# Patient Record
Sex: Male | Born: 1990 | Race: Black or African American | Hispanic: No | Marital: Single | Smoking: Former smoker
Health system: Southern US, Community
[De-identification: ages and names within clinical notes are randomized; demographics above are authoritative.]

---
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
## 2012-12-10 ENCOUNTER — Encounter (HOSPITAL_COMMUNITY): Payer: Self-pay | Admitting: *Deleted

## 2012-12-10 ENCOUNTER — Emergency Department (HOSPITAL_COMMUNITY)
Admission: EM | Admit: 2012-12-10 | Discharge: 2012-12-11 | Disposition: A | Discharge status: Home / Self Care | Payer: Self-pay | Attending: Emergency Medicine | Admitting: Emergency Medicine

## 2012-12-10 DIAGNOSIS — K529 Noninfective gastroenteritis and colitis, unspecified: Secondary | ICD-10-CM

## 2012-12-10 DIAGNOSIS — K5289 Other specified noninfective gastroenteritis and colitis: Secondary | ICD-10-CM | POA: Insufficient documentation

## 2012-12-10 DIAGNOSIS — F17200 Nicotine dependence, unspecified, uncomplicated: Secondary | ICD-10-CM | POA: Insufficient documentation

## 2012-12-10 DIAGNOSIS — R1110 Vomiting, unspecified: Secondary | ICD-10-CM | POA: Insufficient documentation

## 2012-12-10 MED ORDER — ACETAMINOPHEN 325 MG PO TABS
650.0000 mg | ORAL_TABLET | Freq: Once | ORAL | Status: AC
  Administered 2012-12-10: 650 mg via ORAL
  Filled 2012-12-10: qty 2

## 2012-12-10 MED ORDER — ONDANSETRON 4 MG PO TBDP
4.0000 mg | ORAL_TABLET | Freq: Once | ORAL | Status: AC
Start: 2012-12-10 — End: 2012-12-10
  Administered 2012-12-10: 4 mg via ORAL
  Filled 2012-12-10: qty 1

## 2012-12-10 NOTE — ED Notes (Signed)
Pt c/o abdominal pain, n/v x 10 +. Pt presents w/ fever and chills and generalized body aches.

## 2012-12-10 NOTE — ED Provider Notes (Signed)
History   Scribed for Jeff Hutching, MD, the patient was seen in room WTR5/WTR5 . This chart was scribed by Lewanda Rife.   CSN: [id number]  Arrival date & time 12/10/12  1906   First MD Initiated Contact with Patient 12/10/12 2316      Chief Complaint  Patient presents with  . Fever  . Emesis    (Consider location/radiation/quality/duration/timing/severity/associated sxs/prior treatment) HPI Jeff Taylor is a 22 y.o. male who presents to the Emergency Department complaining of waxing and waning moderate left upper quadrant abdominal pain, fever, and emesis since this morning. Pt reports he was eating seafood out last night. Pt reports having 10 episodes of emesis with "beef stew appearance" since 6 am this morning, no blood, no bile. Pt states last episode of emesis was at 5 pm. Pt reports abdominal pain is 6/10 in severity. Pt reports having 4 episodes of diarrhea today. Pt denies melena, hematochezia, chest pain, and dysuria. Pt reports dizziness when standing up, chills, fever of 102.5, body aches, and nausea. Pt denies taking anything to treat symptoms prior to arrival. Pt denies any significant past medical hx.   History reviewed. No pertinent past medical history.  History reviewed. No pertinent past surgical history.  History reviewed. No pertinent family history.  History  Substance Use Topics  . Smoking status: Current Some Day Smoker  . Smokeless tobacco: Not on file  . Alcohol Use: No      Review of Systems  Constitutional: Positive for fever.  HENT: Negative.  Negative for neck pain and neck stiffness.   Respiratory: Negative.   Cardiovascular: Negative.   Gastrointestinal: Positive for nausea, vomiting, abdominal pain and diarrhea. Negative for constipation.  Genitourinary: Negative for dysuria, urgency, frequency and flank pain.  Musculoskeletal: Negative.  Negative for back pain.  Skin: Negative.   Neurological: Negative.   Hematological: Negative.     Psychiatric/Behavioral: Negative.   All other systems reviewed and are negative.    Allergies  Review of patient's allergies indicates no known allergies.  Home Medications  No current outpatient prescriptions on file.  BP 120/63  Pulse 99  Temp 102.5 F (39.2 C) (Oral)  Resp 16  Ht 6\' 2"  (1.88 m)  Wt 218 lb (98.884 kg)  BMI 27.99 kg/m2  SpO2 100%  Physical Exam  Nursing note and vitals reviewed. Constitutional: He is oriented to person, place, and time. He appears well-developed and well-nourished.  Non-toxic appearance.  HENT:  Head: Normocephalic and atraumatic.  Eyes: Conjunctivae normal and EOM are normal.  Neck: Normal range of motion. Neck supple.       No meningeal signs  Cardiovascular: Normal rate.   Pulmonary/Chest: Effort normal and breath sounds normal. He has no wheezes. He has no rales.  Abdominal: Soft. Bowel sounds are normal. He exhibits no distension and no mass. There is tenderness (mild tendernes left upper quadrant ). There is no rebound and no guarding.       No murphy, no rovsign, no pain at mcburney's point  Genitourinary:       No flank pain  Musculoskeletal: Normal range of motion. He exhibits no tenderness.       No flank pain  Neurological: He is alert and oriented to person, place, and time.  Skin: Skin is warm and dry.       Normal cap refill   Psychiatric: He has a normal mood and affect.    ED Course  Procedures (including critical care time)  Labs Reviewed -  No data to display No results found.    Gastroenteritis   MDM  Nausea and vomiting had subsided by the time patient was seen. Pt given tylenol for fever, zofran for nausea, and successfully completed fluid challenge. Pt states he had eaten shrimp at a restaurant the night before. No one else ate the shrimp and no other sick contacts. Clinical suspicion low for acute abdomen (SBO, appendicitis, gall bladder). Given sudden onset and subsiding of nausea and vomiting suspect  viral infection. Directed pt to return if symptoms persist or worsen.   Glade Nurse, PA-C 12/11/12 1321

## 2012-12-11 NOTE — ED Provider Notes (Signed)
Medical screening examination/treatment/procedure(s) were performed by non-physician practitioner and as supervising physician I was immediately available for consultation/collaboration.  Allycia Pitz, MD 12/11/12 1532 

## 2013-04-01 ENCOUNTER — Emergency Department (HOSPITAL_COMMUNITY)
Admission: EM | Admit: 2013-04-01 | Discharge: 2013-04-01 | Disposition: A | Discharge status: Home / Self Care | Payer: Self-pay | Attending: Emergency Medicine | Admitting: Emergency Medicine

## 2013-04-01 ENCOUNTER — Encounter (HOSPITAL_COMMUNITY): Payer: Self-pay | Admitting: *Deleted

## 2013-04-01 DIAGNOSIS — R45851 Suicidal ideations: Secondary | ICD-10-CM | POA: Insufficient documentation

## 2013-04-01 DIAGNOSIS — F17200 Nicotine dependence, unspecified, uncomplicated: Secondary | ICD-10-CM | POA: Insufficient documentation

## 2013-04-01 LAB — BASIC METABOLIC PANEL
BUN: 15 mg/dL (ref 6–23)
Chloride: 100 mEq/L (ref 96–112)
GFR calc Af Amer: >90 mL/min (ref >90)
GFR calc non Af Amer: >90 mL/min (ref >90)
Potassium: 3.8 mEq/L (ref 3.5–5.1)
Sodium: 138 mEq/L (ref 135–145)

## 2013-04-01 LAB — RAPID URINE DRUG SCREEN, HOSP PERFORMED
Barbiturates: NOT DETECTED
Benzodiazepines: NOT DETECTED
Opiates: NOT DETECTED

## 2013-04-01 LAB — CBC WITH DIFFERENTIAL/PLATELET
Basophils Absolute: 0 10*3/uL (ref 0.0–0.1)
Eosinophils Relative: 3 % (ref 0–5)
HCT: 43.8 % (ref 39.0–52.0)
Hemoglobin: 14.7 g/dL (ref 13.0–17.0)
Lymphs Abs: 2.1 10*3/uL (ref 0.7–4.0)
MCV: 84.4 fL (ref 78.0–100.0)
Platelets: 226 10*3/uL (ref 150–400)
WBC: 4.5 10*3/uL (ref 4.0–10.5)

## 2013-04-01 NOTE — ED Notes (Signed)
Mother called stating, "The officer told me that when he was asked if he does drugs, he said, 'no'. But he does. He smokes weed daily and I think that may be what is causing his depression, so if yall could help him with that too, please."

## 2013-04-01 NOTE — BH Assessment (Signed)
Assessment Note   Jeff Taylor is an 22 y.o. male. Pt reports for the past 2 years he's had multiple deaths in his family and 4 of them were close to him. His grandfather, Elisha Ponder, nephew and cousin.  He thinks about them a lot and today he was a lone and the thought of his grandfather not being here causes him to have suicidal thoughts which frighten him and he called 911. Pt  denies that he is suicidal and never thought of any plan nor had any intent.  Pt admits to ocassional use of marijuana.  He denies any other drug nor alcohol use. Pt contracts for safety and is referred to [facility]. Dr Deretha Emory agrees with disposition.  Axis I: Depressive Disorder NOS Axis II: Deferred Axis III: History reviewed. No pertinent past medical history. Axis IV: other psychosocial or environmental problems Axis V: 61-70 mild symptoms      Past Medical History: History reviewed. No pertinent past medical history.  History reviewed. No pertinent past surgical history.  Family History: History reviewed. No pertinent family history.  Social History:  reports that he has been smoking Cigarettes.  He has been smoking about 0.00 packs per day. He does not have any smokeless tobacco history on file. He reports that he uses illicit drugs (Marijuana). He reports that he does not drink alcohol.  Additional Social History:  Alcohol / Drug Use Pain Medications: na Prescriptions: na Over the Counter: na History of alcohol / drug use?: Yes  CIWA: CIWA-Ar BP: 140/77 mmHg Pulse Rate: 80 COWS:    Allergies: No Known Allergies  Home Medications:  (Not in a hospital admission)  OB/GYN Status:  No LMP for male patient.  General Assessment Data Location of Assessment: [facility] Assessment: Yes Living Arrangements: Parent Can pt return to current living arrangement?: Yes Admission Status: Voluntary Is patient capable of signing voluntary admission?: Yes Transfer from: Acute Hospital Referral Source:  MD     Risk to self Is patient at risk for suicide?: No Suicidal Plan?: No Access to Means: No What has been your use of drugs/alcohol within the last 12 months?: marujuana Previous Attempts/Gestures: No How many times?: 0 Other Self Harm Risks: na Triggers for Past Attempts: None known Intentional Self Injurious Behavior: None Family Suicide History: No Recent stressful life event(s):  (deaths in family over past 2 years) Persecutory voices/beliefs?: No Depression: Yes Depression Symptoms: Despondent;Insomnia Substance abuse history and/or treatment for substance abuse?: Yes Suicide prevention information given to non-admitted patients: Yes  Risk to Others Homicidal Ideation: No Thoughts of Harm to Others: No Current Homicidal Intent: No Current Homicidal Plan: No Access to Homicidal Means: No History of harm to others?: No Assessment of Violence: None Noted Violent Behavior Description: na Does patient have access to weapons?: No Criminal Charges Pending?: No Does patient have a court date: No  Psychosis Hallucinations: None noted Delusions: None noted  Mental Status Report Appear/Hygiene: Improved Eye Contact: Good Motor Activity: Freedom of movement Speech: Logical/coherent Level of Consciousness: Alert Mood: Depressed Affect: Sad;Appropriate to circumstance Anxiety Level: Minimal Thought Processes: Coherent;Relevant Judgement: Unimpaired Orientation: Person;Place;Time;Situation Obsessive Compulsive Thoughts/Behaviors: None  Cognitive Functioning Concentration: Normal Memory: Recent Intact;Remote Intact IQ: Average Insight: Good Impulse Control: Good Appetite: Good Sleep: Decreased Total Hours of Sleep: 4 Vegetative Symptoms: None  ADLScreening [facility] Assessment Services) Patient's cognitive ability adequate to safely complete daily activities?: Yes Patient able to express need for assistance with ADLs?: Yes Independently performs ADLs?: Yes  (appropriate for developmental  age)  Abuse/Neglect [facility]) Physical Abuse: Denies Verbal Abuse: Denies Sexual Abuse: Denies  Prior Inpatient Therapy Prior Inpatient Therapy: No Prior Therapy Dates: na Prior Therapy Facilty/Provider(s): na Reason for Treatment: na  Prior Outpatient Therapy Prior Outpatient Therapy: No Prior Therapy Dates: na Prior Therapy Facilty/Provider(s): na Reason for Treatment: na  ADL Screening (condition at time of admission) Patient's cognitive ability adequate to safely complete daily activities?: Yes Patient able to express need for assistance with ADLs?: Yes Independently performs ADLs?: Yes (appropriate for developmental age) Weakness of Legs: None Weakness of Arms/Hands: None  Home Assistive Devices/Equipment Home Assistive Devices/Equipment: None  Therapy Consults (therapy consults require a physician order) PT Evaluation Needed: No OT Evalulation Needed: No SLP Evaluation Needed: No Abuse/Neglect Assessment (Assessment to be complete while patient is alone) Physical Abuse: Denies Verbal Abuse: Denies Sexual Abuse: Denies Values / Beliefs Cultural Requests During Hospitalization: None Spiritual Requests During Hospitalization: None   Advance Directives (For Healthcare) Advance Directive: Patient does not have advance directive;Patient would like information Pre-existing out of facility DNR order (yellow form or pink MOST form): No    Additional Information 1:1 In Past 12 Months?: No CIRT Risk: No Elopement Risk: No Does patient have medical clearance?: Yes     Disposition: Pt contracted for safety and is referred to [facility] Disposition Initial Assessment Completed for this Encounter: Yes Disposition of Patient: Referred to Patient referred to:  ([facility])  On Site Evaluation by:   Reviewed with Physician:  Dr Marlou Sa, Samson Frederic Winford 04/01/2013 6:53 PM

## 2013-04-01 NOTE — ED Notes (Signed)
Pt's mother here to visit, was wanded per policy. Mother states son "smokes pot all day and this makes him depressed then he runs his mouth about everything". Mother visited briefly without confrontation. Act team member at bedside discussing plan of care and talking to pt.

## 2013-04-01 NOTE — ED Provider Notes (Addendum)
History    This chart was scribed for Jeff Jakes, MD by [name], ED Scribe. The patient was seen in room APA16A/APA16A. Patient's care was started at 1622.   CSN: [id number]  Arrival date & time 04/01/13  1622   First MD Initiated Contact with Patient 04/01/13 1642      No chief complaint on file.   The history is provided by the patient. No language interpreter was used.    HPI Comments:  Jeff Taylor is a 22 y.o. male with no significant medical hx, BIB Police to the Emergency Department for medical clearance of suicidal ideations. Pt called 911 today, reporting that he was suicidal and "upset with life in general." He denies having any plan of self injury or access to weapons at home, stating that he has "contemplated, but never attempted suicide." Pt denies SI now. He denies headache, diaphoresis, visual changes, fever, chills, nausea, vomiting, diarrhea, weakness, cough, SOB and any other pain. Pt is a current everyday smoker, admitting occassional marijuana and denying alcohol use.     History reviewed. No pertinent past medical history.  History reviewed. No pertinent past surgical history.  History reviewed. No pertinent family history.  History  Substance Use Topics  . Smoking status: Current Some Day Smoker    Types: Cigarettes  . Smokeless tobacco: Not on file  . Alcohol Use: No    Review of Systems  Constitutional: Negative for appetite change and fatigue.  HENT: Negative for congestion, sinus pressure and ear discharge.   Eyes: Negative for discharge.  Respiratory: Negative for cough and shortness of breath.   Cardiovascular: Negative for chest pain.  Gastrointestinal: Negative for abdominal pain and diarrhea.  Genitourinary: Negative for frequency and hematuria.  Musculoskeletal: Negative for back pain.  Skin: Negative for rash.  Neurological: Negative for headaches.  Hematological: Does not bruise/bleed easily.  Psychiatric/Behavioral:  Positive for suicidal ideas. Negative for confusion.    Allergies  Review of patient's allergies indicates no known allergies.  Home Medications  No current outpatient prescriptions on file.  BP 140/77  Pulse 80  Temp(Src) 97.6 F (36.4 C) (Oral)  Resp 20  Ht 6\' 2"  (1.88 m)  Wt 218 lb (98.884 kg)  BMI 27.98 kg/m2  SpO2 100%  Physical Exam  Constitutional: He is oriented to person, place, and time. He appears well-developed.  HENT:  Head: Normocephalic.  Eyes: Conjunctivae and EOM are normal. No scleral icterus.  Neck: Neck supple. No thyromegaly present.  Cardiovascular: Normal rate and regular rhythm.  Exam reveals no gallop and no friction rub.   No murmur heard. Pulmonary/Chest: Effort normal and breath sounds normal. No stridor. He has no wheezes. He has no rales. He exhibits no tenderness.  Abdominal: Soft. Bowel sounds are normal. He exhibits no distension. There is no tenderness. There is no rebound.  Musculoskeletal: Normal range of motion. He exhibits no edema.  Lymphadenopathy:    He has no cervical adenopathy.  Neurological: He is alert and oriented to person, place, and time. No cranial nerve deficit. He exhibits normal muscle tone. Coordination normal.  Skin: No rash noted. No erythema.  Psychiatric: He has a normal mood and affect. His behavior is normal.    ED Course  Procedures DIAGNOSTIC STUDIES: Oxygen Saturation is 100% on room air, normal by my interpretation.    COORDINATION OF CARE: 17:23- Evaluated Pt. Pt is awake, alert, and without distress. 17:27- Patient understands and agrees with initial ED impression and plan with expectations  set for ED visit.     Labs Reviewed  CBC WITH DIFFERENTIAL  BASIC METABOLIC PANEL  URINE RAPID DRUG SCREEN (HOSP PERFORMED)   No results found. Results for orders placed during the hospital encounter of 04/01/13  CBC WITH DIFFERENTIAL      Result Value Range   WBC 4.5  4.0 - 10.5 K/uL   RBC 5.19  4.22 -  5.81 MIL/uL   Hemoglobin 14.7  13.0 - 17.0 g/dL   HCT 16.1  09.6 - 04.5 %   MCV 84.4  78.0 - 100.0 fL   MCH 28.3  26.0 - 34.0 pg   MCHC 33.6  30.0 - 36.0 g/dL   RDW 40.9  81.1 - 91.4 %   Platelets 226  150 - 400 K/uL   Neutrophils Relative 43  43 - 77 %   Neutro Abs 1.9  1.7 - 7.7 K/uL   Lymphocytes Relative 46  12 - 46 %   Lymphs Abs 2.1  0.7 - 4.0 K/uL   Monocytes Relative 7  3 - 12 %   Monocytes Absolute 0.3  0.1 - 1.0 K/uL   Eosinophils Relative 3  0 - 5 %   Eosinophils Absolute 0.2  0.0 - 0.7 K/uL   Basophils Relative 0  0 - 1 %   Basophils Absolute 0.0  0.0 - 0.1 K/uL  BASIC METABOLIC PANEL      Result Value Range   Sodium 138  135 - 145 mEq/L   Potassium 3.8  3.5 - 5.1 mEq/L   Chloride 100  96 - 112 mEq/L   CO2 28  19 - 32 mEq/L   Glucose, Bld 87  70 - 99 mg/dL   BUN 15  6 - 23 mg/dL   Creatinine, Ser 7.82  0.50 - 1.35 mg/dL   Calcium 9.7  8.4 - 95.6 mg/dL   GFR calc non Af Amer >90  >90 mL/min   GFR calc Af Amer >90  >90 mL/min     1. Suicidal ideation       MDM  Patient seen by [facility] behavioral health team member. Patient has signed a contract for safety is no longer suicidal and can be discharged home. Will be given behavioral health referral information. Patient to return for any new or worse symptoms or any recurrent feelings of suicide. The patient's urine drug screen is not back yet but would be positive for marijuana as per behavioral health t patient is nontoxic no acute distress.eam patient admits to marijuana use.      I personally performed the services described in this documentation, which was scribed in my presence. The recorded information has been reviewed and is accurate.      Jeff Jakes, MD 04/01/13 1850  Jeff Jakes, MD 04/01/13 [phone]

## 2013-04-01 NOTE — ED Notes (Addendum)
Pt brought in by police, tearful, Asking to call his mother.Police officer says pt called and threatened to hurt himself.  Was on phone with girl friend when police arrived.

## 2019-09-13 ENCOUNTER — Other Ambulatory Visit: Payer: Self-pay | Admitting: *Deleted

## 2019-09-13 DIAGNOSIS — Z20822 Contact with and (suspected) exposure to covid-19: Secondary | ICD-10-CM

## 2019-09-15 LAB — NOVEL CORONAVIRUS, NAA: SARS-CoV-2, NAA: NOT DETECTED

## 2020-06-06 ENCOUNTER — Ambulatory Visit (INDEPENDENT_AMBULATORY_CARE_PROVIDER_SITE_OTHER): Payer: Self-pay | Admitting: Orthopaedic Surgery

## 2020-06-06 ENCOUNTER — Ambulatory Visit (INDEPENDENT_AMBULATORY_CARE_PROVIDER_SITE_OTHER): Payer: Self-pay

## 2020-06-06 ENCOUNTER — Encounter: Payer: Self-pay | Admitting: Orthopaedic Surgery

## 2020-06-06 VITALS — Ht 73.5 in | Wt 243.2 lb

## 2020-06-06 DIAGNOSIS — M79652 Pain in left thigh: Secondary | ICD-10-CM

## 2020-06-06 MED ORDER — IBUPROFEN 800 MG PO TABS: 800.0000 mg | ORAL_TABLET | Freq: Three times a day (TID) | ORAL | 2 refills | Status: DC | PRN

## 2020-06-06 NOTE — Progress Notes (Signed)
Office Visit Note   Patient: Jeff Taylor           Date of Birth: 1991/04/19           MRN: [id number] Visit Date: 06/06/2020              Requested by: No referring provider defined for this encounter. PCP: Patient, No Pcp Per   Assessment & Plan: Visit Diagnoses:  1. Left thigh pain     Plan: Impression is a mild quadriceps strain versus partial tear.  I have recommended outpatient physical therapy for the next few weeks.  He needs to be out of sports until then.  Recheck in 3 weeks.  Ibuprofen was prescribed today.  Follow-Up Instructions: Return in about 3 weeks (around 06/27/2020).   Orders:  Orders Placed This Encounter  Procedures   XR FEMUR MIN 2 VIEWS LEFT   Ambulatory referral to Physical Therapy   Meds ordered this encounter  Medications   ibuprofen (ADVIL) 800 MG tablet    Sig: Take 1 tablet (800 mg total) by mouth every 8 (eight) hours as needed.    Dispense:  30 tablet    Refill:  2      Procedures: No procedures performed   Clinical Data: No additional findings.   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  This started acutely when he was at a trampoline park and jumped off of the jousting beam and landed on the floor on his feet and felt acute pain in his thigh.  He was unable to run during practice due to the pain.  He is able to squat with his body weight but he definitely feels discomfort in the thigh.  Denies any groin pain or knee pain.  Denies any numbness and tingling.   Review of Systems  Constitutional: Negative.   All other systems reviewed and are negative.    Objective: Vital Signs: Ht 6' 1.5" (1.867 m)    Wt 243 lb 3.2 oz (110.3 kg)    BMI 31.65 kg/m   Physical Exam Vitals and nursing note reviewed.   Constitutional:      Appearance: He is well-developed.  HENT:     Head: Normocephalic and atraumatic.  Eyes:     Pupils: Pupils are equal, round, and reactive to light.  Pulmonary:     Effort: Pulmonary effort is normal.  Abdominal:     Palpations: Abdomen is soft.  Musculoskeletal:        General: Normal range of motion.     Cervical back: Neck supple.  Skin:    General: Skin is warm.  Neurological:     Mental Status: He is alert and oriented to person, place, and time.  Psychiatric:        Behavior: Behavior normal.        Thought Content: Thought content normal.        Judgment: Judgment normal.     Ortho Exam Left hip and knee exams are unremarkable. He has slight tenderness in the proximal portion of the quadriceps.  He has minimal weakness to resisted knee extension.  He does have reproducible discomfort with resistance to quad activation. Specialty Comments:  No specialty comments available.  Imaging: XR FEMUR MIN 2 VIEWS  LEFT  Result Date: 06/06/2020 No acute or structural abnormalities    PMFS History: There are no problems to display for this patient.  History reviewed. No pertinent past medical history.  History reviewed. No pertinent family history.  History reviewed. No pertinent surgical history. Social History   Occupational History   Not on file  Tobacco Use   Smoking status: Former Smoker    Types: Cigarettes    Quit date: 04/06/2020    Years since quitting: 0.1   Smokeless tobacco: Never Used  Substance and Sexual Activity   Alcohol use: No   Drug use: Yes    Types: Marijuana    Comment: occasionally   Sexual activity: Not on file

## 2020-06-07 ENCOUNTER — Emergency Department (HOSPITAL_COMMUNITY): Payer: Managed Care, Other (non HMO)

## 2020-06-07 ENCOUNTER — Encounter (HOSPITAL_COMMUNITY): Payer: Self-pay

## 2020-06-07 ENCOUNTER — Other Ambulatory Visit: Payer: Self-pay

## 2020-06-07 ENCOUNTER — Emergency Department (HOSPITAL_COMMUNITY)
Admission: EM | Admit: 2020-06-07 | Discharge: 2020-06-07 | Disposition: A | Discharge status: Home / Self Care | Payer: Managed Care, Other (non HMO) | Attending: Emergency Medicine | Admitting: Emergency Medicine

## 2020-06-07 DIAGNOSIS — X58XXXA Exposure to other specified factors, initial encounter: Secondary | ICD-10-CM | POA: Insufficient documentation

## 2020-06-07 DIAGNOSIS — T189XXA Foreign body of alimentary tract, part unspecified, initial encounter: Secondary | ICD-10-CM | POA: Insufficient documentation

## 2020-06-07 DIAGNOSIS — Z87891 Personal history of nicotine dependence: Secondary | ICD-10-CM | POA: Insufficient documentation

## 2020-06-07 DIAGNOSIS — Y929 Unspecified place or not applicable: Secondary | ICD-10-CM | POA: Insufficient documentation

## 2020-06-07 DIAGNOSIS — Y939 Activity, unspecified: Secondary | ICD-10-CM | POA: Insufficient documentation

## 2020-06-07 DIAGNOSIS — Z87821 Personal history of retained foreign body fully removed: Secondary | ICD-10-CM | POA: Insufficient documentation

## 2020-06-07 DIAGNOSIS — Y999 Unspecified external cause status: Secondary | ICD-10-CM | POA: Insufficient documentation

## 2020-06-07 NOTE — ED Triage Notes (Signed)
Pt went to eat at [employer] and when he bit into the hamburger he noticed little pieces of glass in the hamburger. Pt had swallowed some hamburger prior to noticing the glass.

## 2020-06-07 NOTE — Discharge Instructions (Addendum)
At this time there does not appear to be the presence of an emergent medical condition, however there is always the potential for conditions to change. Please read and follow the below instructions.  Please return to the Emergency Department immediately for any new or worsening symptoms. Please be sure to follow up with your Primary Care Provider within one week regarding your visit today; please call their office to schedule an appointment even if you are feeling better for a follow-up visit.  Call  community health and wellness to establish a primary care provider if you do not already have one. You may call the gastroenterologist Dr. Kendell Bane on your discharge paperwork to schedule follow-up appointment.  Get help right away if: You have a fever. You have pain in your chest or your belly. You cough up blood. You have blood in your poop. You have blood in your vomit after treatment. You have any new/concerning or worsening of symptoms  Please read the additional information packets attached to your discharge summary.  Do not take your medicine if  develop an itchy rash, swelling in your mouth or lips, or difficulty breathing; call 911 and seek immediate emergency medical attention if this occurs.  You may review your lab tests and imaging results in their entirety on your MyChart account.  Please discuss all results of fully with your primary care provider and other specialist at your follow-up visit.  Note: Portions of this text may have been transcribed using voice recognition software. Every effort was made to ensure accuracy; however, inadvertent computerized transcription errors may still be present.

## 2020-06-07 NOTE — ED Provider Notes (Signed)
[facility] EMERGENCY DEPARTMENT Provider Note   CSN: [id number] Arrival date & time: 06/07/20  1518     History Chief Complaint  Patient presents with  . Swallowed Foreign Body    Jeff Taylor is a 29 y.o. male otherwise healthy no daily medication use presents today for concern of swallowing small shards of glass.  He reports that at lunchtime today he was eating a [employer] when he bit into a hamburger and noticed a few very small pieces of glass.  He had already eaten some the hamburger by this time.  He denies any fever/chills, sore throat, cough, pain, nausea/vomiting, diarrhea, hematochezia/melena or any additional concerns.  HPI     History reviewed. No pertinent past medical history.  There are no problems to display for this patient.   History reviewed. No pertinent surgical history.     No family history on file.  Social History   Tobacco Use  . Smoking status: Former Smoker    Types: Cigarettes    Quit date: 04/06/2020    Years since quitting: 0.1  . Smokeless tobacco: Never Used  Substance Use Topics  . Alcohol use: No  . Drug use: Not Currently    Types: Marijuana    Comment: occasionally    Home Medications Prior to Admission medications   Medication Sig Start Date End Date Taking? Authorizing Provider  ibuprofen (ADVIL) 800 MG tablet Take 1 tablet (800 mg total) by mouth every 8 (eight) hours as needed. Patient taking differently: Take 800 mg by mouth every 8 (eight) hours as needed for mild pain or moderate pain (for leg pain).  06/06/20  Yes Tarry Kos, MD    Allergies    Patient has no known allergies.  Review of Systems   Review of Systems Ten systems are reviewed and are negative for acute change except as noted in the HPI  Physical Exam Updated Vital Signs BP (!) 120/92 (BP Location: Left Arm)   Pulse 70   Temp 98.3 F (36.8 C) (Oral)   Resp 17   Ht 6\' 2"  (1.88 m)   Wt 106.6 kg   SpO2 99%   BMI 30.17 kg/m    Physical Exam Constitutional:      General: He is not in acute distress.    Appearance: Normal appearance. He is well-developed. He is not ill-appearing or diaphoretic.  HENT:     Head: Normocephalic and atraumatic.     Jaw: There is normal jaw occlusion.     Mouth/Throat:     Mouth: Mucous membranes are moist.     Pharynx: Oropharynx is clear.     Comments: No bleeding, lacerations or visible injury. Eyes:     General: Vision grossly intact. Gaze aligned appropriately.     Pupils: Pupils are equal, round, and reactive to light.  Neck:     Trachea: Trachea and phonation normal.  Pulmonary:     Effort: Pulmonary effort is normal. No respiratory distress.  Abdominal:     General: There is no distension.     Palpations: Abdomen is soft.     Tenderness: There is no abdominal tenderness. There is no guarding or rebound.  Musculoskeletal:        General: Normal range of motion.     Cervical back: Normal range of motion.  Skin:    General: Skin is warm and dry.  Neurological:     Mental Status: He is alert.     GCS: GCS eye subscore  is 4. GCS verbal subscore is 5. GCS motor subscore is 6.     Comments: Speech is clear and goal oriented, follows commands Major Cranial nerves without deficit, no facial droop Moves extremities without ataxia, coordination intact  Psychiatric:        Behavior: Behavior normal.     ED Results / Procedures / Treatments   Labs (all labs ordered are listed, but only abnormal results are displayed) Labs Reviewed - No data to display  EKG None  Radiology DG Abdomen Acute W/Chest  Result Date: 06/07/2020 CLINICAL DATA:  Question swallowed glass. Found broken glass in food well eating. Abdominal discomfort. EXAM: DG ABDOMEN ACUTE W/ 1V CHEST COMPARISON:  None. FINDINGS: The cardiomediastinal contours are normal. The lungs are clear. No evidence of pneumomediastinum. No pleural fluid or pneumothorax. No radiopaque foreign bodies. There is no free  intra-abdominal air. No dilated bowel loops to suggest obstruction. Small volume of stool throughout the colon. No radiopaque calculi. No acute osseous abnormalities are seen. IMPRESSION: 1. No radiopaque foreign body or evidence of swallowed glass. No obstruction or free air. 2. Clear lungs.  No acute pulmonary process. Electronically Signed   By: Narda Rutherford M.D.   On: 06/07/2020 17:26   XR FEMUR MIN 2 VIEWS LEFT  Result Date: 06/06/2020 No acute or structural abnormalities   Procedures Procedures (including critical care time)  Medications Ordered in ED Medications - No data to display  ED Course  I have reviewed the triage vital signs and the nursing notes.  Pertinent labs & imaging results that were available during my care of the patient were reviewed by me and considered in my medical decision making (see chart for details).  Clinical Course as of Jun 07 1805  Thu Jun 07, 2020  1752 Dr. Jena Gauss   [BM]    Clinical Course User Index [BM] Elizabeth Palau   MDM Rules/Calculators/A&P                          DG Abdomen/Chest:  IMPRESSION:  1. No radiopaque foreign body or evidence of swallowed glass. No  obstruction or free air.  2. Clear lungs. No acute pulmonary process.  - Patient reassessed she is resting comfortably no acute distress.  No intraoral injury, no neck pain, no chest pain or cough, no abdominal pain.  He is overall well-appearing and in no acute distress requesting discharge.  I discussed the case with gastroenterologist Dr. Jena Gauss advised that such a small foreign body would likely passed through without adverse event but can give patient his contact information to schedule follow-up appointment as needed.  Patient is agreeable to plan and discharge, contact information for Dr. Luvenia Starch office given.  At this time there does not appear to be any evidence of an acute emergency medical condition and the patient appears stable for discharge with  appropriate outpatient follow up. Diagnosis was discussed with patient who verbalizes understanding of care plan and is agreeable to discharge. I have discussed return precautions with patient who verbalizes understanding. Patient encouraged to follow-up with their PCP and GI. All questions answered.  Patient's case discussed with Dr. Deretha Emory who agrees with plan to discharge with follow-up.   Note: Portions of this report may have been transcribed using voice recognition software. Every effort was made to ensure accuracy; however, inadvertent computerized transcription errors may still be present. Final Clinical Impression(s) / ED Diagnoses Final diagnoses:  History of swallowed foreign body  Rx / DC Orders ED Discharge Orders    None       Elizabeth Palau 06/07/20 1806    Vanetta Mulders, MD 06/08/20 [phone]

## 2020-06-21 ENCOUNTER — Ambulatory Visit: Payer: Managed Care, Other (non HMO) | Attending: Orthopaedic Surgery | Admitting: Physical Therapy

## 2020-06-27 ENCOUNTER — Ambulatory Visit: Payer: Managed Care, Other (non HMO) | Admitting: Orthopaedic Surgery

## 2021-09-24 ENCOUNTER — Ambulatory Visit
Admission: EM | Admit: 2021-09-24 | Discharge: 2021-09-24 | Disposition: A | Discharge status: Home / Self Care | Payer: Managed Care, Other (non HMO) | Attending: Family Medicine | Admitting: Family Medicine

## 2021-09-24 ENCOUNTER — Encounter: Payer: Self-pay | Admitting: Emergency Medicine

## 2021-09-24 DIAGNOSIS — J069 Acute upper respiratory infection, unspecified: Secondary | ICD-10-CM | POA: Diagnosis not present

## 2021-09-24 LAB — POCT INFLUENZA A/B
Influenza A, POC: NEGATIVE
Influenza B, POC: NEGATIVE

## 2021-09-24 MED ORDER — OSELTAMIVIR PHOSPHATE 75 MG PO CAPS: 75.0000 mg | ORAL_CAPSULE | Freq: Two times a day (BID) | ORAL | 0 refills | Status: AC

## 2021-09-24 MED ORDER — IBUPROFEN 800 MG PO TABS: 800.0000 mg | ORAL_TABLET | Freq: Three times a day (TID) | ORAL | 0 refills | Status: AC

## 2021-09-24 NOTE — ED Triage Notes (Signed)
Pt presents with chills, body aches, cough, and nose bleeds that started this am.   Pt denies tylenol, states will take when he gets home.

## 2021-09-24 NOTE — Discharge Instructions (Signed)

## 2021-09-25 NOTE — ED Provider Notes (Signed)
  [facility]   [id number] 09/24/21 Arrival Time: 1419  ASSESSMENT & PLAN:  1. Viral URI with cough    Discussed typical duration of viral illnesses. Rapid flu negative. OTC symptom care as needed.  Meds ordered this encounter  Medications   oseltamivir (TAMIFLU) 75 MG capsule    Sig: Take 1 capsule (75 mg total) by mouth 2 (two) times daily for 5 days.    Dispense:  10 capsule    Refill:  0   ibuprofen (ADVIL) 800 MG tablet    Sig: Take 1 tablet (800 mg total) by mouth 3 (three) times daily with meals.    Dispense:  21 tablet    Refill:  0     Follow-up Information     [facility].   Specialty: Urgent Care Why: If worsening or failing to improve as anticipated. Contact information: [address] [place] [address] [phone]                Reviewed expectations re: course of current medical issues. Questions answered. Outlined signs and symptoms indicating need for more acute intervention. Understanding verbalized. After Visit Summary given.   SUBJECTIVE: History from: patient. Jeff Taylor is a 30 y.o. male who reports: chills, body aches, cough; one nose bleed; onset this am. Denies: fever. Normal PO intake without n/v/d.   OBJECTIVE:  Vitals:   09/24/21 1617  BP: (!) 142/84  Pulse: (!) 105  Resp: 17  Temp: (!) 102.2 F (39 C)  TempSrc: Oral  SpO2: 97%    Mild tachycardia and temp noted.  General appearance: alert; no distress Eyes: PERRLA; EOMI; conjunctiva normal HENT: [place]; AT; with nasal congestion Neck: supple  Lungs: speaks full sentences without difficulty; unlabored; clear Extremities: no edema Skin: warm and dry Neurologic: normal gait Psychological: alert and cooperative; normal mood and affect  Labs: Results for orders placed or performed during the hospital encounter of 09/24/21  POCT Influenza A/B  Result Value Ref Range   Influenza A, POC Negative  Negative   Influenza B, POC Negative Negative   Labs Reviewed  POCT INFLUENZA A/B    No Known Allergies  History reviewed. No pertinent past medical history. Social History   Socioeconomic History   Marital status: Single    Spouse name: Not on file   Number of children: Not on file   Years of education: Not on file   Highest education level: Not on file  Occupational History   Not on file  Tobacco Use   Smoking status: Former    Types: Cigarettes    Quit date: 04/06/2020    Years since quitting: 1.4   Smokeless tobacco: Never  Substance and Sexual Activity   Alcohol use: No   Drug use: Not Currently    Types: Marijuana    Comment: occasionally   Sexual activity: Not on file  Other Topics Concern   Not on file  Social History Narrative   Not on file   Social Determinants of Health   Financial Resource Strain: Not on file  Food Insecurity: Not on file  Transportation Needs: Not on file  Physical Activity: Not on file  Stress: Not on file  Social Connections: Not on file  Intimate Partner Violence: Not on file   History reviewed. No pertinent family history. History reviewed. No pertinent surgical history.   Mardella Layman, MD 09/25/21 [phone]

## 2022-01-28 IMAGING — DX DG ABDOMEN ACUTE W/ 1V CHEST
4 series · 4 of 4 positions shown · non-contrast
Comparison: None.

CLINICAL DATA: Question swallowed glass. Found broken glass in food
well eating. Abdominal discomfort.

EXAM:
DG ABDOMEN ACUTE W/ 1V CHEST

[chest pa]
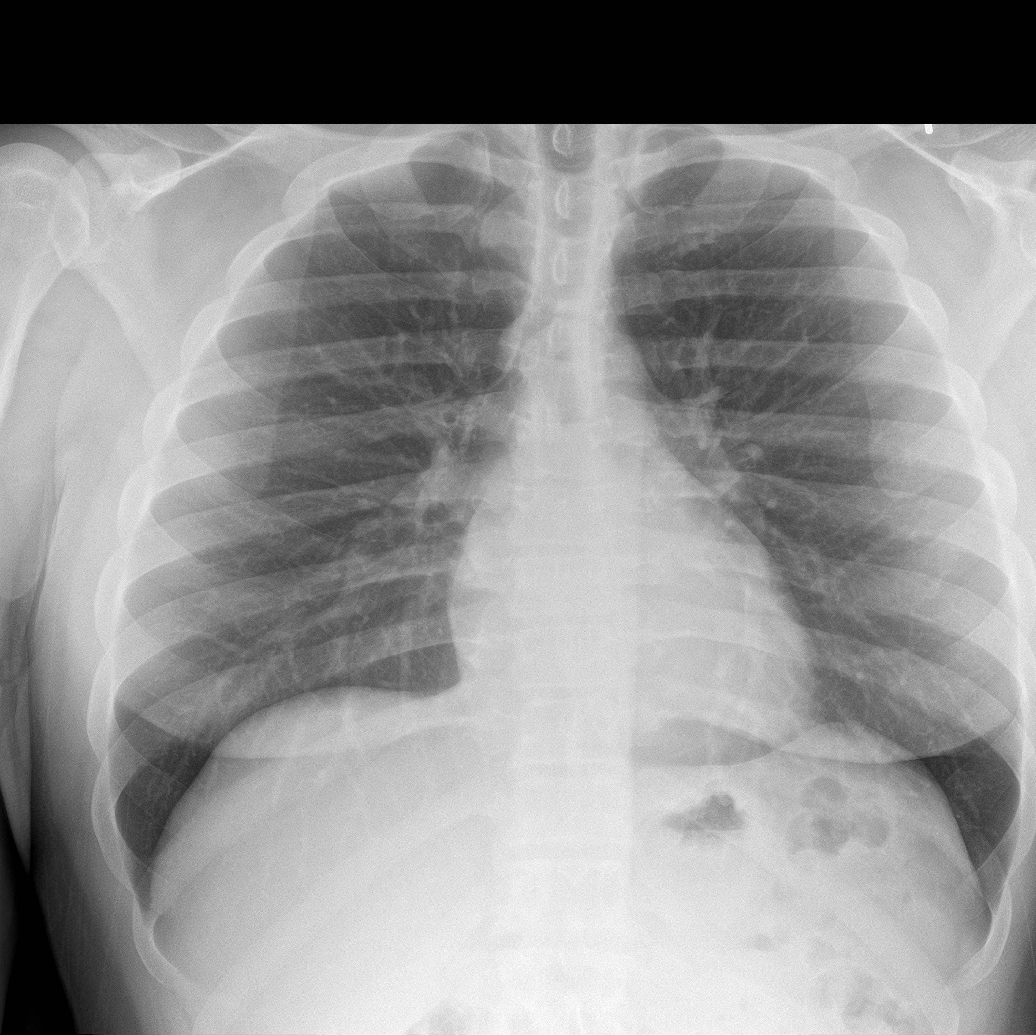

[abdomen erect]
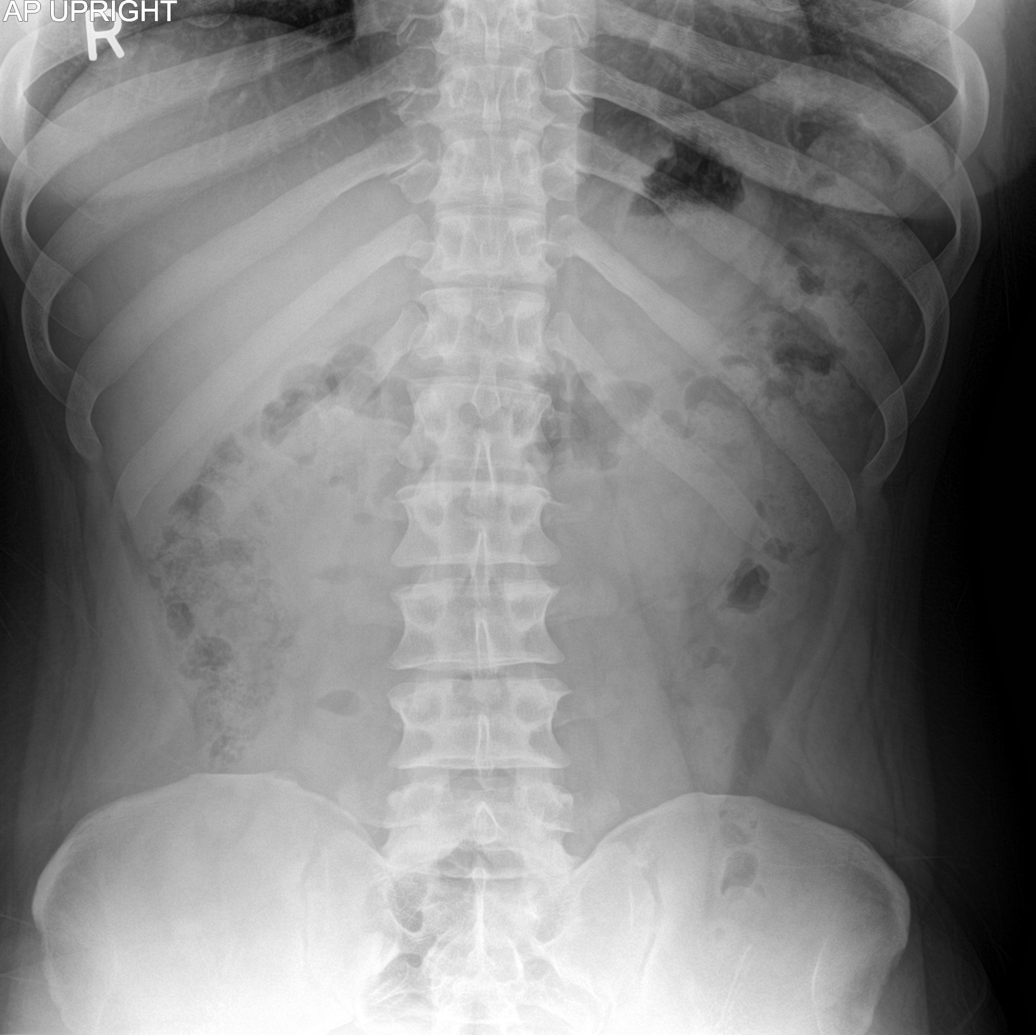

[abdomen supine]
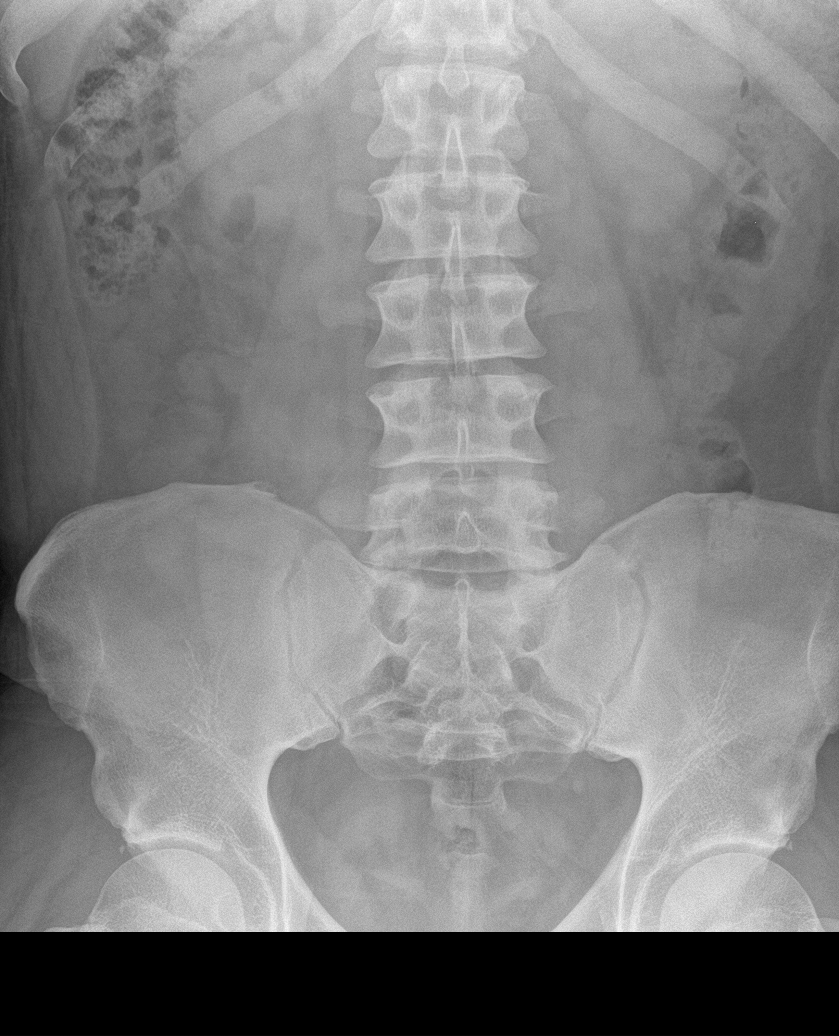

[abdomen kub]
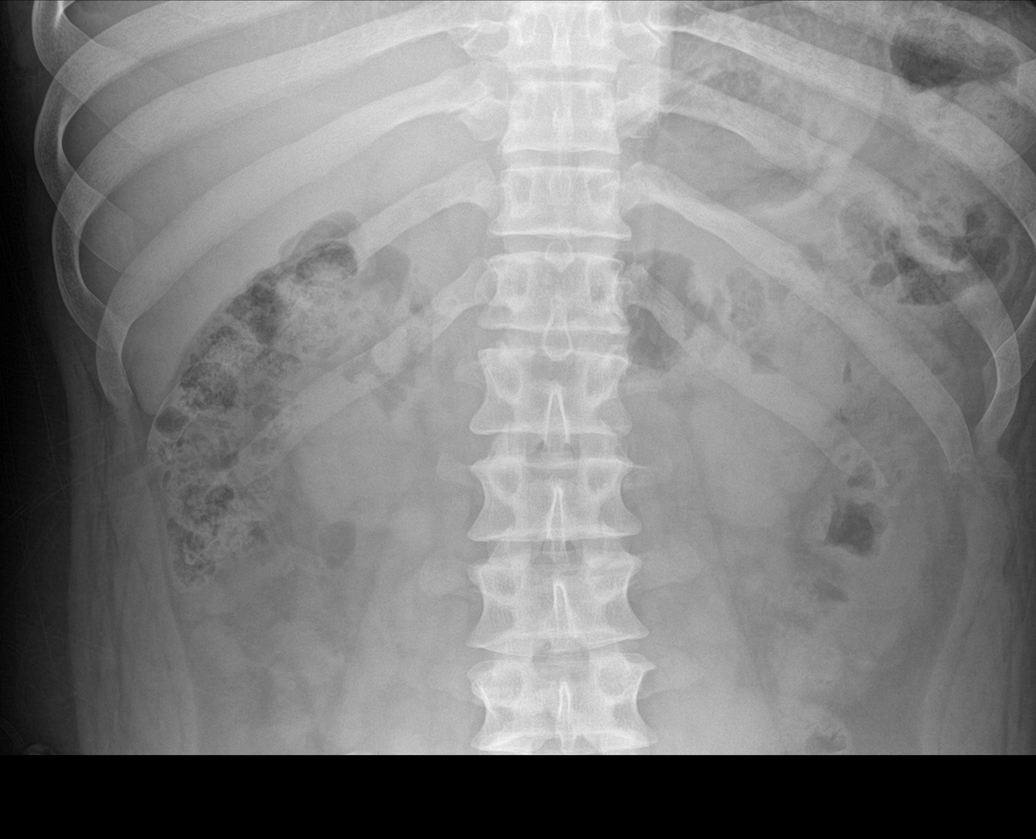

[4 of 4 positions shown; findings below may reference images not displayed]

FINDINGS: The cardiomediastinal contours are normal. The lungs are clear. No
evidence of pneumomediastinum. No pleural fluid or pneumothorax.

No radiopaque foreign bodies. There is no free intra-abdominal air.
No dilated bowel loops to suggest obstruction. Small volume of stool
throughout the colon. No radiopaque calculi. No acute osseous
abnormalities are seen.
IMPRESSION: 1. No radiopaque foreign body or evidence of swallowed glass. No
obstruction or free air.
2. Clear lungs.  No acute pulmonary process.

## 2024-11-21 ENCOUNTER — Ambulatory Visit: Attending: Obstetrics and Gynecology

## 2024-11-21 DIAGNOSIS — Z8481 Family history of carrier of genetic disease: Secondary | ICD-10-CM

## 2024-11-21 DIAGNOSIS — Z31440 Encounter of male for testing for genetic disease carrier status for procreative management: Secondary | ICD-10-CM

## 2024-12-05 LAB — HORIZON CUSTOM: REPORT SUMMARY: POSITIVE — AB

## 2024-12-12 ENCOUNTER — Ambulatory Visit: Payer: Self-pay | Admitting: Obstetrics and Gynecology
